# Patient Record
Sex: Female | Born: 1980 | Race: White | Hispanic: No | Marital: Married | Smoking: Never smoker
Health system: Southern US, Community
[De-identification: ages and names within clinical notes are randomized; demographics above are authoritative.]

## PROBLEM LIST (undated history)

## (undated) HISTORY — PX: CHOLECYSTECTOMY: SHX55

---

## 2005-08-23 ENCOUNTER — Ambulatory Visit: Payer: Self-pay | Admitting: Obstetrics and Gynecology

## 2012-08-11 ENCOUNTER — Ambulatory Visit: Payer: Self-pay | Admitting: Internal Medicine

## 2013-02-19 ENCOUNTER — Ambulatory Visit: Payer: Self-pay | Admitting: Surgery

## 2014-08-11 ENCOUNTER — Emergency Department: Admit: 2014-08-11 | Discharge status: Against Medical Advice | Payer: Self-pay | Admitting: Emergency Medicine

## 2014-08-11 LAB — BASIC METABOLIC PANEL
Anion Gap: 4 — ABNORMAL LOW (ref 7–16)
BUN: 9 mg/dL
CALCIUM: 8.5 mg/dL — AB
CREATININE: 0.68 mg/dL
Chloride: 104 mmol/L
Co2: 29 mmol/L
Glucose: 129 mg/dL — ABNORMAL HIGH
Potassium: 3.8 mmol/L
Sodium: 137 mmol/L

## 2014-08-11 LAB — CBC
HCT: 37.4 % (ref 35.0–47.0)
HGB: 12.1 g/dL (ref 12.0–16.0)
MCH: 27 pg (ref 26.0–34.0)
MCHC: 32.4 g/dL (ref 32.0–36.0)
MCV: 83 fL (ref 80–100)
Platelet: 263 10*3/uL (ref 150–440)
RBC: 4.49 10*6/uL (ref 3.80–5.20)
RDW: 15.8 % — ABNORMAL HIGH (ref 11.5–14.5)
WBC: 8.7 10*3/uL (ref 3.6–11.0)

## 2014-08-11 LAB — TROPONIN I

## 2014-08-15 ENCOUNTER — Other Ambulatory Visit: Payer: Self-pay | Admitting: Neurology

## 2014-08-15 DIAGNOSIS — H5712 Ocular pain, left eye: Secondary | ICD-10-CM

## 2014-08-15 DIAGNOSIS — M79602 Pain in left arm: Secondary | ICD-10-CM | POA: Insufficient documentation

## 2014-08-15 DIAGNOSIS — R200 Anesthesia of skin: Secondary | ICD-10-CM

## 2014-08-15 DIAGNOSIS — Z01812 Encounter for preprocedural laboratory examination: Secondary | ICD-10-CM

## 2014-08-15 HISTORY — DX: Encounter for preprocedural laboratory examination: Z01.812

## 2014-08-15 HISTORY — DX: Ocular pain, left eye: H57.12

## 2014-08-15 HISTORY — DX: Anesthesia of skin: R20.0

## 2014-08-15 HISTORY — DX: Pain in left arm: M79.602

## 2014-08-16 NOTE — Op Note (Signed)
PATIENT NAME:  Terri Fischer, Christiana M MR#:  [id number] DATE OF BIRTH:  [birth year]  DATE OF PROCEDURE:  02/19/2013  ATTENDING PHYSICIAN:  Dr. Salome Holmeshris Brenin Heidelberger.  PREOPERATIVE DIAGNOSIS:  Symptomatic cholelithiasis.   POSTOPERATIVE DIAGNOSIS: Symptomatic cholelithiasis.   PROCEDURE PERFORMED: Laparoscopic cholecystectomy.   ESTIMATED BLOOD LOSS: 20 mL.   COMPLICATIONS: None.   SPECIMEN:  Gallbladder.   ANESTHESIA: General endotracheal.   INDICATIONS FOR SURGERY: Ms. [name] is a pleasant 34 year old female who presents with right upper quadrant pain with fatty gallstones and pain, which is biliary in character. She thus was brought to the Operating Room suite for laparoscopic cholecystectomy.   DETAILS OF PROCEDURE AS FOLLOWS:   Informed consent was obtained.  Ms. [name] was brought to the operating room suite. She was laid supine on the operating room table. She was induced. Endotracheal tube was placed, general anesthesia was administered. Her abdomen was then prepped and draped in a standard surgical fashion. A timeout was then performed correctly identifying the patient name, operative site and procedure to be performed. A supraumbilical incision was made and deepened down to the fascia. The fascia was incised. The peritoneum was entered. Two stay sutures were placed through the fascia. Hasson trocar was placed in the abdomen. The abdomen was insufflated. An 11 mm epigastric and two 5 mm subcostal ports were placed at the anterior axillary and midclavicular line. The gallbladder then was retracted over the dome of the liver. The cystic duct and cystic artery were dissected out, so a critical view was obtained. The structures were clipped 3 times and ligated. The gallbladder was then taken off the gallbladder fossa using hook electrocautery. It was then taken out through the umbilical incision with an Endo Catch bag. The abdomen was then irrigated. The gallbladder fossa was examined, and using  electrocautery, was made hemostatic. The trocars were then taken out under direct visualization. Pneumoperitoneum was evacuated. The supraumbilical fascia was closed with a figure-of-eight 0 Vicryl. All incisions were then closed with 4-0 Monocryl deep dermal sutures. Steri-Strips, Telfa gauze and Tegaderm were then used to complete the dressing. The patient was then awoken, extubated and brought to the postanesthesia care unit. There were no immediate complications. Needle, sponge and instrument counts were correct at the end of the procedure.      ____________________________ Si Raiderhristopher A. Pravin Perezperez, MD cal:dmm D: 02/19/2013 09:48:10 ET T: 02/19/2013 11:09:28 ET JOB#: [id number]  cc: Cristal Deerhristopher A. Kassandra Meriweather, MD, <Dictator> Jarvis NewcomerHRISTOPHER A Phebe Dettmer MD ELECTRONICALLY SIGNED 02/26/2013 18:25

## 2014-08-18 ENCOUNTER — Other Ambulatory Visit: Payer: Self-pay | Admitting: Cardiovascular Disease

## 2014-08-21 ENCOUNTER — Ambulatory Visit
Admit: 2014-08-21 | Disposition: A | Discharge status: Home / Self Care | Payer: Self-pay | Attending: Neurology | Admitting: Neurology

## 2014-08-27 ENCOUNTER — Other Ambulatory Visit: Payer: Self-pay

## 2014-12-23 ENCOUNTER — Ambulatory Visit: Payer: Self-pay

## 2014-12-23 ENCOUNTER — Ambulatory Visit (INDEPENDENT_AMBULATORY_CARE_PROVIDER_SITE_OTHER): Payer: BLUE CROSS/BLUE SHIELD | Admitting: Podiatry

## 2014-12-23 ENCOUNTER — Encounter: Payer: Self-pay | Admitting: Podiatry

## 2014-12-23 VITALS — BP 114/76 | HR 84 | Resp 12

## 2014-12-23 DIAGNOSIS — M722 Plantar fascial fibromatosis: Secondary | ICD-10-CM

## 2014-12-23 MED ORDER — DICLOFENAC SODIUM 75 MG PO TBEC: 75.0000 mg | DELAYED_RELEASE_TABLET | Freq: Two times a day (BID) | ORAL | Status: DC

## 2014-12-23 MED ORDER — METHYLPREDNISOLONE 4 MG PO TBPK: ORAL_TABLET | ORAL | Status: DC

## 2014-12-23 NOTE — Patient Instructions (Signed)

## 2014-12-23 NOTE — Progress Notes (Signed)
   Subjective:    Patient ID: Terri Fischer, female    DOB: 12-Feb-1981, 34 y.o.   MRN: [id number]  HPI: She presents today as a 34 year old white female with pain to her bilateral heels. She states that they've been going on for about 6 months and seems to be getting worse. States that the right heel seems to be worse than the left of the lateral aspect of the left heel seems to be bothering her more. She denies any trauma states that morning pain is the worst.    Review of Systems  Musculoskeletal: Positive for gait problem.       Objective:   Physical Exam: 34 year old white female no acute distress vital signs are stable alert and oriented 3. Pulses are strongly palpable bilateral. Capillary fill time to digits 1 through 5 is immediate. Normal neurologic sensorium per Semmes-Weinstein monofilament. Deep tendon reflexes are brisk and equal bilateral. Muscle strength +5 over 5 dorsiflexion plantar flexors and inverters everters all intrinsic musculature is intact. Orthopedic evaluation of a straight solid joints distal to the ankle before range of motion without crepitation. Cutaneous evaluation demonstrates supple well-hydrated cutis no erythema edema saline as drainage or odor. She has pain on palpation medial calcaneal tubercles of the bilateral foot right greater than left. Radiographs 3 views each foot taken outside of the office do demonstrate plantar distally oriented calcaneal heel spurs with soft tissue increase in density. Cutaneous evaluation of the straight supple well-hydrated cutis assessing no signs of infection.        Assessment & Plan:  Assessment: Plantar fasciitis bilateral. 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  Plan: Discussed etiology pathology conservative versus surgical therapies. We discussed appropriate shoe gear stretching exercises ice therapy and shoe modifications. We dispensed a prescription for Medrol Dosepak to be followed by meloxicam. I also  injected her bilateral heels today with Kenalog and local and aesthetic. Dispensed a plantar fascial brace and the night splint. I will follow-up with her in 1 month.  Arbutus Ped DPM

## 2015-01-27 ENCOUNTER — Ambulatory Visit: Payer: BLUE CROSS/BLUE SHIELD | Admitting: Podiatry

## 2015-09-23 ENCOUNTER — Ambulatory Visit (INDEPENDENT_AMBULATORY_CARE_PROVIDER_SITE_OTHER): Payer: BLUE CROSS/BLUE SHIELD | Admitting: Podiatry

## 2015-09-23 ENCOUNTER — Encounter: Payer: Self-pay | Admitting: Podiatry

## 2015-09-23 VITALS — BP 128/76 | HR 91 | Resp 18

## 2015-09-23 DIAGNOSIS — M722 Plantar fascial fibromatosis: Secondary | ICD-10-CM | POA: Diagnosis not present

## 2015-09-23 MED ORDER — ETODOLAC 500 MG PO TABS: 500.0000 mg | ORAL_TABLET | Freq: Two times a day (BID) | ORAL | Status: DC

## 2015-09-23 NOTE — Progress Notes (Signed)
Patient ID: Terri SpragueSonia M Fischer, female   DOB: 1981-02-11, 35 y.o.   MRN: [id number]  Subjective: 35 year old female presents the office today for continued pain to her right heel which is been ongoing for several years and this is an ongoing issue. She previously saw a rheumatologist and she had an injection the last 2 years. She follow with Dr. Al CorpusHyatt last year and she had bilateral heel injections in the left side is doing well however she does have right heel pain continuing. She has been using night splint which she felt made area worse and she has been using the braces which did not help. She is also been stretching icing. She has tried changing her shoes. She appears to be frustrated at this point with her heel. No recent injury or trauma. No tingling or numbness. The pain does not wake her up at night. Denies any systemic complaints such as fevers, chills, nausea, vomiting. No acute changes since last appointment, and no other complaints at this time.   Objective: AAO x3, NAD DP/PT pulses palpable bilaterally, CRT less than 3 seconds There is tenderness palpation of the plantar medial tubercle of the calcaneus at the insertion of the plantar fashion on the right side. There is no pain on the course of plantar fascial in the arch of the foot. There is no pain with medial to lateral compression of the calcaneus. There is no pain on the course or insertion Achilles tendon. No overlying edema, erythema, increase in warmth. There is no pain with left-sided this time. No areas of pinpoint bony tenderness or pain with vibratory sensation. MMT 5/5, ROM WNL. No edema, erythema, increase in warmth to bilateral lower extremities.  No open lesions or pre-ulcerative lesions.  No pain with calf compression, swelling, warmth, erythema  Assessment: Chronic right heel pain, plantar fasciitis  Plan: -All treatment options discussed with the patient including all alternatives, risks, complications.  -At this point  discussed both conservative and surgical treatment options. During the discussion multiple treatment options she appeared to be frustrated she is attended multiple treatments. However I would like to get her on a consistent treatment plan. I discussed steroid injection for which she wishes to proceed. Under sterile conditions a mixture of Kenalog and local anesthetic was infiltrated into the area of maximal tenderness without complications. Post injection care discussed. I discussed her custom orthotics. She was scanned for orthotics and she'll call the office today to mention it to proceed with them. She's never had inserts made. Prescribed etodolac and discussed side effects this medicine. Also discussed with her continuing to stretch, ice as well as shoe gear changes. I discussed with her other treatment options including physical therapy, EPAT, surgery. She'll let to hold off on this options at this point as well. At next appointment she is not getting relief of symptoms she states that she will possibly consider EPAT.  -Follow-up as scheduled or sooner if needed. -Patient encouraged to call the office with any questions, concerns, change in symptoms.    Ovid CurdMatthew Airyn Ellzey, DPM

## 2015-10-10 ENCOUNTER — Ambulatory Visit (INDEPENDENT_AMBULATORY_CARE_PROVIDER_SITE_OTHER): Payer: BLUE CROSS/BLUE SHIELD | Admitting: *Deleted

## 2015-10-10 ENCOUNTER — Encounter: Payer: Self-pay | Admitting: *Deleted

## 2015-10-10 DIAGNOSIS — M722 Plantar fascial fibromatosis: Secondary | ICD-10-CM

## 2015-10-10 NOTE — Progress Notes (Signed)
Dispensed orthotics. Patient states that the right orthotic was uncomfortable. Feedback from the pt indicated that the shell of the orthotic was hitting at the bend of the ball of her foot. Will send the orthotics back for adjustment and notify the pt when they come back.

## 2015-10-21 ENCOUNTER — Ambulatory Visit: Payer: BLUE CROSS/BLUE SHIELD | Admitting: Podiatry

## 2015-10-29 ENCOUNTER — Ambulatory Visit (INDEPENDENT_AMBULATORY_CARE_PROVIDER_SITE_OTHER): Payer: BLUE CROSS/BLUE SHIELD | Admitting: Podiatry

## 2015-10-29 DIAGNOSIS — M722 Plantar fascial fibromatosis: Secondary | ICD-10-CM

## 2015-11-04 ENCOUNTER — Telehealth: Payer: Self-pay | Admitting: *Deleted

## 2015-11-04 MED ORDER — ETODOLAC 500 MG PO TABS: 500.0000 mg | ORAL_TABLET | Freq: Two times a day (BID) | ORAL | Status: DC

## 2015-11-04 NOTE — Telephone Encounter (Signed)
Refill request for Etodolac 500mg .  Dr. Ardelle AntonWagoner ordered as previously no additional refills until seen.

## 2015-11-14 NOTE — Progress Notes (Signed)
Patient states that her right orthotic js still not comfortable and doesn't fit correctly. Betha foam casted the pt and molds were sent out as well as the old orthotic for a remake. Will contact pt once it comes in.

## 2015-11-21 ENCOUNTER — Encounter: Payer: Self-pay | Admitting: *Deleted

## 2015-11-28 ENCOUNTER — Encounter: Payer: Self-pay | Admitting: *Deleted

## 2015-12-31 ENCOUNTER — Ambulatory Visit (INDEPENDENT_AMBULATORY_CARE_PROVIDER_SITE_OTHER): Payer: BLUE CROSS/BLUE SHIELD | Admitting: *Deleted

## 2015-12-31 DIAGNOSIS — M722 Plantar fascial fibromatosis: Secondary | ICD-10-CM

## 2016-01-06 NOTE — Progress Notes (Signed)
Patient presents today stating that her right orthotic was sent back for remake, but doesn't match her left. She says its uncomfortable. Betha recasted pt with plaster molds and took pics of old orthotics and sent back with cast to [facility] for remake of both orthotics. Will contact pt once they arrive.

## 2016-01-29 ENCOUNTER — Telehealth: Payer: Self-pay | Admitting: *Deleted

## 2016-01-29 NOTE — Telephone Encounter (Signed)
Checking the status of my orthotics I haven't heard anything.

## 2016-01-30 NOTE — Telephone Encounter (Signed)
They were just ordered on 01/11/16. They have not come in yet. I will call her and let her know. TLowella Dandy

## 2016-02-18 ENCOUNTER — Encounter: Payer: Self-pay | Admitting: Podiatry

## 2016-09-17 DIAGNOSIS — R7982 Elevated C-reactive protein (CRP): Secondary | ICD-10-CM

## 2016-09-17 DIAGNOSIS — M25471 Effusion, right ankle: Secondary | ICD-10-CM

## 2016-09-17 DIAGNOSIS — R21 Rash and other nonspecific skin eruption: Secondary | ICD-10-CM

## 2016-09-17 DIAGNOSIS — M25472 Effusion, left ankle: Secondary | ICD-10-CM

## 2016-09-17 HISTORY — DX: Elevated C-reactive protein (CRP): R79.82

## 2016-09-17 HISTORY — DX: Effusion, right ankle: M25.471

## 2016-09-28 DIAGNOSIS — D869 Sarcoidosis, unspecified: Secondary | ICD-10-CM | POA: Insufficient documentation

## 2019-07-02 ENCOUNTER — Other Ambulatory Visit: Payer: Self-pay | Admitting: Nurse Practitioner

## 2019-07-02 DIAGNOSIS — Z1231 Encounter for screening mammogram for malignant neoplasm of breast: Secondary | ICD-10-CM

## 2019-08-01 ENCOUNTER — Ambulatory Visit
Admission: RE | Admit: 2019-08-01 | Discharge: 2019-08-01 | Disposition: A | Discharge status: Home / Self Care | Payer: BLUE CROSS/BLUE SHIELD | Source: Ambulatory Visit | Attending: Nurse Practitioner | Admitting: Nurse Practitioner

## 2019-08-01 ENCOUNTER — Encounter: Payer: Self-pay | Admitting: Radiology

## 2019-08-01 DIAGNOSIS — Z1231 Encounter for screening mammogram for malignant neoplasm of breast: Secondary | ICD-10-CM | POA: Diagnosis present

## 2020-04-08 ENCOUNTER — Telehealth: Payer: Self-pay | Admitting: Podiatry

## 2020-04-08 NOTE — Telephone Encounter (Signed)
Pt left voicemail for a call back about orthotics she got several years ago.  I returned call and pt got them in 2017 and was asking about a warranty. I told her usually a 1 yr warranty. She then asked the cost and I told her currently they are 438.00 for the pair. We then discussed refurbishing for 90.00 and she asked if they were cracked if they could be refurbished. I told pt Raiford Noble would be in the [place] office tomorrow to stop in and he can look at them quickly and see if they can be refurbished or not.

## 2020-04-16 ENCOUNTER — Other Ambulatory Visit: Payer: Self-pay

## 2020-04-16 ENCOUNTER — Ambulatory Visit (INDEPENDENT_AMBULATORY_CARE_PROVIDER_SITE_OTHER): Payer: BLUE CROSS/BLUE SHIELD | Admitting: Orthotics

## 2020-04-16 DIAGNOSIS — M722 Plantar fascial fibromatosis: Secondary | ICD-10-CM

## 2020-04-16 NOTE — Progress Notes (Signed)
Patient came in today to get her f/o refurbisheds; will pick up in January

## 2020-04-30 ENCOUNTER — Other Ambulatory Visit: Payer: BLUE CROSS/BLUE SHIELD | Admitting: Orthotics

## 2020-04-30 ENCOUNTER — Other Ambulatory Visit: Payer: Self-pay

## 2021-04-09 ENCOUNTER — Other Ambulatory Visit: Payer: Self-pay | Admitting: Nurse Practitioner

## 2021-04-09 DIAGNOSIS — Z1231 Encounter for screening mammogram for malignant neoplasm of breast: Secondary | ICD-10-CM

## 2021-06-03 ENCOUNTER — Other Ambulatory Visit: Payer: Self-pay

## 2021-06-03 ENCOUNTER — Ambulatory Visit
Admission: RE | Admit: 2021-06-03 | Discharge: 2021-06-03 | Disposition: A | Discharge status: Home / Self Care | Payer: BC Managed Care – PPO | Source: Ambulatory Visit | Attending: Nurse Practitioner | Admitting: Nurse Practitioner

## 2021-06-03 DIAGNOSIS — Z1231 Encounter for screening mammogram for malignant neoplasm of breast: Secondary | ICD-10-CM | POA: Diagnosis present

## 2021-06-10 ENCOUNTER — Other Ambulatory Visit: Payer: Self-pay | Admitting: Nurse Practitioner

## 2021-06-10 DIAGNOSIS — N6489 Other specified disorders of breast: Secondary | ICD-10-CM

## 2021-06-10 DIAGNOSIS — R928 Other abnormal and inconclusive findings on diagnostic imaging of breast: Secondary | ICD-10-CM

## 2021-06-16 ENCOUNTER — Other Ambulatory Visit: Payer: Self-pay

## 2021-06-16 ENCOUNTER — Ambulatory Visit
Admission: RE | Admit: 2021-06-16 | Discharge: 2021-06-16 | Disposition: A | Discharge status: Home / Self Care | Payer: BC Managed Care – PPO | Source: Ambulatory Visit | Attending: Nurse Practitioner | Admitting: Nurse Practitioner

## 2021-06-16 DIAGNOSIS — N6489 Other specified disorders of breast: Secondary | ICD-10-CM | POA: Insufficient documentation

## 2021-06-16 DIAGNOSIS — R928 Other abnormal and inconclusive findings on diagnostic imaging of breast: Secondary | ICD-10-CM | POA: Diagnosis not present

## 2021-10-01 DIAGNOSIS — Z1211 Encounter for screening for malignant neoplasm of colon: Secondary | ICD-10-CM | POA: Insufficient documentation

## 2021-10-01 DIAGNOSIS — F988 Other specified behavioral and emotional disorders with onset usually occurring in childhood and adolescence: Secondary | ICD-10-CM | POA: Insufficient documentation

## 2021-10-01 DIAGNOSIS — R194 Change in bowel habit: Secondary | ICD-10-CM | POA: Insufficient documentation

## 2022-05-27 ENCOUNTER — Other Ambulatory Visit: Payer: Self-pay | Admitting: Family

## 2022-05-27 DIAGNOSIS — M25561 Pain in right knee: Secondary | ICD-10-CM

## 2022-05-31 ENCOUNTER — Ambulatory Visit: Payer: BC Managed Care – PPO

## 2022-05-31 DIAGNOSIS — M25561 Pain in right knee: Secondary | ICD-10-CM

## 2022-06-17 ENCOUNTER — Other Ambulatory Visit: Payer: Self-pay

## 2022-06-17 MED ORDER — LISDEXAMFETAMINE DIMESYLATE 40 MG PO CAPS: 40.0000 mg | ORAL_CAPSULE | ORAL | 0 refills | Status: DC

## 2022-06-28 ENCOUNTER — Other Ambulatory Visit: Payer: Self-pay | Admitting: Nurse Practitioner

## 2022-07-02 ENCOUNTER — Other Ambulatory Visit: Payer: Self-pay | Admitting: Family

## 2022-07-09 ENCOUNTER — Encounter: Payer: Self-pay | Admitting: Family

## 2022-07-09 ENCOUNTER — Ambulatory Visit (INDEPENDENT_AMBULATORY_CARE_PROVIDER_SITE_OTHER): Payer: BC Managed Care – PPO | Admitting: Family

## 2022-07-09 ENCOUNTER — Other Ambulatory Visit: Payer: Self-pay | Admitting: Family

## 2022-07-09 DIAGNOSIS — J301 Allergic rhinitis due to pollen: Secondary | ICD-10-CM | POA: Insufficient documentation

## 2022-07-09 DIAGNOSIS — N921 Excessive and frequent menstruation with irregular cycle: Secondary | ICD-10-CM | POA: Insufficient documentation

## 2022-07-09 DIAGNOSIS — K219 Gastro-esophageal reflux disease without esophagitis: Secondary | ICD-10-CM | POA: Insufficient documentation

## 2022-07-09 DIAGNOSIS — L0882 Omphalitis not of newborn: Secondary | ICD-10-CM

## 2022-07-09 NOTE — Progress Notes (Signed)
Established Patient Office Visit  Subjective:  Patient ID: Terri Fischer, female    DOB: 11-Sep-1980  Age: 42 y.o. MRN: [id number]  Chief Complaint  Patient presents with   possible infection in umbilicus    Patient is here with possible infection in her umbilicus.  She states that these started when she had her cholecystectomy, and that she has to keep an alcohol swab in her umbilicus every day.  She often cleans with witch hazel as well, but she says that this time it is not going away.   The other day she reports that she took the alcohol swab out and it was covered in brown discharge. She also reports that it is foul smelling.   No other concerns today.     No other concerns at this time.   Past Medical History:  Diagnosis Date   Arm pain, left 08/15/2014   CRP elevated 09/17/2016   Left sided numbness 08/15/2014   Pain in left eye 08/15/2014   Pre-procedure lab exam 08/15/2014   Skin rash 09/17/2016   Formatting of this note might be different from the original. erythema nodosum   Swelling of both ankles 09/17/2016    Past Surgical History:  Procedure Laterality Date   CHOLECYSTECTOMY      Social History   Socioeconomic History   Marital status: Married    Spouse name: Not on file   Number of children: Not on file   Years of education: Not on file   Highest education level: Not on file  Occupational History   Not on file  Tobacco Use   Smoking status: Never   Smokeless tobacco: Not on file  Substance and Sexual Activity   Alcohol use: No    Alcohol/week: 0.0 standard drinks of alcohol   Drug use: No   Sexual activity: Not on file  Other Topics Concern   Not on file  Social History Narrative   Not on file   Social Determinants of Health   Financial Resource Strain: Not on file  Food Insecurity: Not on file  Transportation Needs: Not on file  Physical Activity: Not on file  Stress: Not on file  Social Connections: Not on file  Intimate Partner  Violence: Not on file    Family History  Problem Relation Age of Onset   Cancer Mother    Hyperlipidemia Father    Hypertension Father    Diabetes Father     Allergies  Allergen Reactions   Loratadine     Other Reaction(s): Other (See Comments)  Lip edema and mouth blisters and tongue tingling    Review of Systems  Skin:        Discharge and pain at umbilicus.   All other systems reviewed and are negative.      Objective:   There were no vitals taken for this visit.  There were no vitals filed for this visit.  Physical Exam Vitals and nursing note reviewed.  Constitutional:      Appearance: Normal appearance.  HENT:     Head: Normocephalic and atraumatic.  Abdominal:     Tenderness: There is abdominal tenderness in the periumbilical area.    Neurological:     Mental Status: She is alert.      No results found for any visits on 07/09/22.  No results found for this or any previous visit (from the past 2160 hour(s)).    Assessment & Plan:   Problem List Items Addressed This Visit  Allergic rhinitis due to pollen   GERD (gastroesophageal reflux disease)   Menometrorrhagia   Other Visit Diagnoses     Omphalitis in adult    -  Primary   will get a CT scan of her abdomen.  Also getting swab for culture.   Relevant Orders   Culture, Wound   CT ABDOMEN W CONTRAST       Return once CT scan is completed, for F/U.   Total time spent: 20 minutes  Mechele Claude, FNP  07/09/2022

## 2022-07-12 LAB — WOUND CULTURE

## 2022-07-13 ENCOUNTER — Ambulatory Visit (INDEPENDENT_AMBULATORY_CARE_PROVIDER_SITE_OTHER): Payer: BC Managed Care – PPO

## 2022-07-13 DIAGNOSIS — L03316 Cellulitis of umbilicus: Secondary | ICD-10-CM

## 2022-07-13 DIAGNOSIS — L0882 Omphalitis not of newborn: Secondary | ICD-10-CM

## 2022-07-13 MED ORDER — IOHEXOL 300 MG/ML  SOLN
100.0000 mL | Freq: Once | INTRAMUSCULAR | Status: AC | PRN
  Administered 2022-07-13: 100 mL via INTRAVENOUS

## 2022-07-14 ENCOUNTER — Other Ambulatory Visit: Payer: Self-pay | Admitting: Family

## 2022-07-14 MED ORDER — CEFDINIR 300 MG PO CAPS: 300.0000 mg | ORAL_CAPSULE | Freq: Two times a day (BID) | ORAL | 0 refills | Status: DC

## 2022-07-14 MED ORDER — AMPHETAMINE-DEXTROAMPHET ER 20 MG PO CP24: 20.0000 mg | ORAL_CAPSULE | Freq: Every morning | ORAL | 0 refills | Status: DC

## 2022-07-14 MED ORDER — ALPRAZOLAM 0.5 MG PO TABS: 0.5000 mg | ORAL_TABLET | Freq: Two times a day (BID) | ORAL | 3 refills | Status: DC | PRN

## 2022-08-18 ENCOUNTER — Other Ambulatory Visit: Payer: Self-pay

## 2022-08-18 MED ORDER — LISDEXAMFETAMINE DIMESYLATE 40 MG PO CAPS: 40.0000 mg | ORAL_CAPSULE | ORAL | 0 refills | Status: DC

## 2022-09-20 ENCOUNTER — Other Ambulatory Visit: Payer: Self-pay | Admitting: Family

## 2022-09-22 ENCOUNTER — Other Ambulatory Visit: Payer: Self-pay | Admitting: Family

## 2022-09-22 DIAGNOSIS — Z1231 Encounter for screening mammogram for malignant neoplasm of breast: Secondary | ICD-10-CM

## 2022-09-22 MED ORDER — AMPHETAMINE-DEXTROAMPHET ER 20 MG PO CP24: 20.0000 mg | ORAL_CAPSULE | Freq: Every morning | ORAL | 0 refills | Status: DC

## 2022-10-02 ENCOUNTER — Encounter: Payer: Self-pay | Admitting: Family

## 2022-10-04 ENCOUNTER — Other Ambulatory Visit: Payer: Self-pay | Admitting: Family

## 2022-10-04 MED ORDER — SULFAMETHOXAZOLE-TRIMETHOPRIM 800-160 MG PO TABS: 1.0000 | ORAL_TABLET | Freq: Two times a day (BID) | ORAL | 0 refills | Status: AC

## 2022-10-04 MED ORDER — MUPIROCIN 2 % EX OINT: 1.0000 | TOPICAL_OINTMENT | Freq: Two times a day (BID) | CUTANEOUS | 0 refills | Status: AC

## 2022-11-03 ENCOUNTER — Other Ambulatory Visit: Payer: Self-pay | Admitting: Family

## 2022-11-03 DIAGNOSIS — G8929 Other chronic pain: Secondary | ICD-10-CM

## 2022-11-04 ENCOUNTER — Ambulatory Visit: Payer: BC Managed Care – PPO

## 2022-11-04 ENCOUNTER — Ambulatory Visit (INDEPENDENT_AMBULATORY_CARE_PROVIDER_SITE_OTHER): Payer: BC Managed Care – PPO

## 2022-11-04 ENCOUNTER — Ambulatory Visit
Admission: RE | Admit: 2022-11-04 | Discharge: 2022-11-04 | Disposition: A | Discharge status: Home / Self Care | Payer: BC Managed Care – PPO | Source: Ambulatory Visit | Attending: Family | Admitting: Family

## 2022-11-04 DIAGNOSIS — Z1231 Encounter for screening mammogram for malignant neoplasm of breast: Secondary | ICD-10-CM | POA: Insufficient documentation

## 2022-11-04 DIAGNOSIS — G8929 Other chronic pain: Secondary | ICD-10-CM | POA: Diagnosis not present

## 2022-11-04 DIAGNOSIS — M25561 Pain in right knee: Secondary | ICD-10-CM

## 2022-11-17 ENCOUNTER — Ambulatory Visit: Payer: BC Managed Care – PPO | Admitting: Family

## 2022-11-18 ENCOUNTER — Other Ambulatory Visit: Payer: Self-pay

## 2022-11-18 DIAGNOSIS — G8929 Other chronic pain: Secondary | ICD-10-CM

## 2022-11-18 MED ORDER — AMPHETAMINE-DEXTROAMPHET ER 20 MG PO CP24: 20.0000 mg | ORAL_CAPSULE | Freq: Every morning | ORAL | 0 refills | Status: DC

## 2022-11-25 NOTE — Addendum Note (Signed)
Addended by: Grayling Congress on: 11/25/2022 12:29 PM   Modules accepted: Orders

## 2022-11-30 ENCOUNTER — Other Ambulatory Visit: Payer: Self-pay

## 2022-12-01 MED ORDER — ALPRAZOLAM 0.5 MG PO TABS: 0.5000 mg | ORAL_TABLET | Freq: Two times a day (BID) | ORAL | 3 refills | Status: DC | PRN

## 2023-01-18 ENCOUNTER — Other Ambulatory Visit: Payer: Self-pay | Admitting: Family

## 2023-01-18 MED ORDER — AMPHETAMINE-DEXTROAMPHET ER 20 MG PO CP24: 20.0000 mg | ORAL_CAPSULE | Freq: Every morning | ORAL | 0 refills | Status: DC

## 2023-03-04 ENCOUNTER — Other Ambulatory Visit: Payer: Self-pay | Admitting: Family

## 2023-03-04 MED ORDER — AMPHETAMINE-DEXTROAMPHET ER 20 MG PO CP24: 20.0000 mg | ORAL_CAPSULE | Freq: Every morning | ORAL | 0 refills | Status: DC

## 2023-03-29 ENCOUNTER — Ambulatory Visit: Payer: BC Managed Care – PPO | Admitting: Family

## 2023-04-06 ENCOUNTER — Other Ambulatory Visit: Payer: Self-pay

## 2023-04-08 MED ORDER — ALPRAZOLAM 0.5 MG PO TABS: 0.5000 mg | ORAL_TABLET | Freq: Two times a day (BID) | ORAL | 3 refills | Status: DC | PRN

## 2023-04-08 MED ORDER — AMPHETAMINE-DEXTROAMPHET ER 20 MG PO CP24: 20.0000 mg | ORAL_CAPSULE | Freq: Every morning | ORAL | 0 refills | Status: DC

## 2023-05-12 MED ORDER — AMPHETAMINE-DEXTROAMPHETAMINE 10 MG PO TABS: 10.0000 mg | ORAL_TABLET | Freq: Two times a day (BID) | ORAL | 0 refills | Status: DC

## 2023-05-12 NOTE — Addendum Note (Signed)
Addended by: Grayling Congress on: 05/12/2023 02:23 PM   Modules accepted: Orders

## 2023-05-30 ENCOUNTER — Other Ambulatory Visit: Payer: Self-pay

## 2023-05-30 DIAGNOSIS — R7303 Prediabetes: Secondary | ICD-10-CM

## 2023-05-30 DIAGNOSIS — R5383 Other fatigue: Secondary | ICD-10-CM

## 2023-05-30 DIAGNOSIS — E559 Vitamin D deficiency, unspecified: Secondary | ICD-10-CM

## 2023-05-30 DIAGNOSIS — E538 Deficiency of other specified B group vitamins: Secondary | ICD-10-CM

## 2023-05-30 DIAGNOSIS — E782 Mixed hyperlipidemia: Secondary | ICD-10-CM

## 2023-05-30 DIAGNOSIS — I10 Essential (primary) hypertension: Secondary | ICD-10-CM

## 2023-05-30 DIAGNOSIS — E039 Hypothyroidism, unspecified: Secondary | ICD-10-CM

## 2023-06-01 MED ORDER — VITAMIN D (ERGOCALCIFEROL) 1.25 MG (50000 UNIT) PO CAPS: 50000.0000 [IU] | ORAL_CAPSULE | ORAL | 3 refills | Status: DC

## 2023-06-03 LAB — LIPID PANEL
Chol/HDL Ratio: 4.9 {ratio} — ABNORMAL HIGH (ref 0.0–4.4)
Cholesterol, Total: 252 mg/dL — ABNORMAL HIGH (ref 100–199)
HDL: 51 mg/dL (ref >39)
LDL Chol Calc (NIH): 165 mg/dL — ABNORMAL HIGH (ref 0–99)
Triglycerides: 197 mg/dL — ABNORMAL HIGH (ref 0–149)
VLDL Cholesterol Cal: 36 mg/dL (ref 5–40)

## 2023-06-03 LAB — CBC WITH DIFFERENTIAL/PLATELET
Basophils Absolute: 0 10*3/uL (ref 0.0–0.2)
Basos: 0 %
EOS (ABSOLUTE): 0.2 10*3/uL (ref 0.0–0.4)
Eos: 2 %
Hematocrit: 43.6 % (ref 34.0–46.6)
Hemoglobin: 14.2 g/dL (ref 11.1–15.9)
Immature Grans (Abs): 0.1 10*3/uL (ref 0.0–0.1)
Immature Granulocytes: 1 %
Lymphocytes Absolute: 1.6 10*3/uL (ref 0.7–3.1)
Lymphs: 18 %
MCH: 28.6 pg (ref 26.6–33.0)
MCHC: 32.6 g/dL (ref 31.5–35.7)
MCV: 88 fL (ref 79–97)
Monocytes Absolute: 0.6 10*3/uL (ref 0.1–0.9)
Monocytes: 7 %
Neutrophils Absolute: 6.2 10*3/uL (ref 1.4–7.0)
Neutrophils: 72 %
Platelets: 316 10*3/uL (ref 150–450)
RBC: 4.97 x10E6/uL (ref 3.77–5.28)
RDW: 13.8 % (ref 11.7–15.4)
WBC: 8.6 10*3/uL (ref 3.4–10.8)

## 2023-06-03 LAB — TSH+T4F+T3FREE
Free T4: 1.07 ng/dL (ref 0.82–1.77)
T3, Free: 3.6 pg/mL (ref 2.0–4.4)
TSH: 1.67 u[IU]/mL (ref 0.450–4.500)

## 2023-06-03 LAB — HEMOGLOBIN A1C
Est. average glucose Bld gHb Est-mCnc: 103 mg/dL
Hgb A1c MFr Bld: 5.2 % (ref 4.8–5.6)

## 2023-06-03 LAB — CMP14+EGFR
ALT: 24 [IU]/L (ref 0–32)
AST: 20 [IU]/L (ref 0–40)
Albumin: 4.2 g/dL (ref 3.9–4.9)
Alkaline Phosphatase: 152 [IU]/L — ABNORMAL HIGH (ref 44–121)
BUN/Creatinine Ratio: 17 (ref 9–23)
BUN: 11 mg/dL (ref 6–24)
Bilirubin Total: 0.3 mg/dL (ref 0.0–1.2)
CO2: 24 mmol/L (ref 20–29)
Calcium: 9.3 mg/dL (ref 8.7–10.2)
Chloride: 101 mmol/L (ref 96–106)
Creatinine, Ser: 0.64 mg/dL (ref 0.57–1.00)
Globulin, Total: 2.7 g/dL (ref 1.5–4.5)
Glucose: 86 mg/dL (ref 70–99)
Potassium: 5 mmol/L (ref 3.5–5.2)
Sodium: 140 mmol/L (ref 134–144)
Total Protein: 6.9 g/dL (ref 6.0–8.5)
eGFR: 112 mL/min/{1.73_m2} (ref >59)

## 2023-06-03 LAB — PTH, INTACT AND CALCIUM: PTH: 39 pg/mL (ref 15–65)

## 2023-06-03 LAB — FSH+PROG+E2+SHBG
Estradiol: 108 pg/mL
FSH: 7.2 m[IU]/mL
Progesterone: 0.1 ng/mL
Sex Hormone Binding: 74.5 nmol/L (ref 24.6–122.0)

## 2023-06-03 LAB — VITAMIN D 25 HYDROXY (VIT D DEFICIENCY, FRACTURES): Vit D, 25-Hydroxy: 56.6 ng/mL (ref 30.0–100.0)

## 2023-06-03 LAB — VITAMIN B12: Vitamin B-12: 597 pg/mL (ref 232–1245)

## 2023-07-04 ENCOUNTER — Other Ambulatory Visit: Payer: Self-pay

## 2023-07-04 ENCOUNTER — Other Ambulatory Visit: Payer: Self-pay | Admitting: Family

## 2023-07-04 MED ORDER — PREDNISONE 20 MG PO TABS: 40.0000 mg | ORAL_TABLET | Freq: Every day | ORAL | 0 refills | Status: DC

## 2023-07-06 MED ORDER — LISDEXAMFETAMINE DIMESYLATE 40 MG PO CAPS: 40.0000 mg | ORAL_CAPSULE | ORAL | 0 refills | Status: DC

## 2023-08-10 MED ORDER — ALBENDAZOLE 200 MG PO TABS: 400.0000 mg | ORAL_TABLET | Freq: Once | ORAL | 0 refills | Status: AC

## 2023-08-10 NOTE — Addendum Note (Signed)
 Addended by: Evander Hills on: 08/10/2023 02:28 PM   Modules accepted: Orders

## 2023-08-30 ENCOUNTER — Other Ambulatory Visit: Payer: Self-pay | Admitting: Family

## 2023-09-26 ENCOUNTER — Other Ambulatory Visit: Payer: Self-pay

## 2023-09-27 ENCOUNTER — Ambulatory Visit
Admission: RE | Admit: 2023-09-27 | Discharge: 2023-09-27 | Disposition: A | Discharge status: Home / Self Care | Source: Ambulatory Visit | Attending: Family | Admitting: Family

## 2023-09-27 DIAGNOSIS — G8929 Other chronic pain: Secondary | ICD-10-CM

## 2023-09-27 MED ORDER — AMPHETAMINE-DEXTROAMPHETAMINE 10 MG PO TABS: 10.0000 mg | ORAL_TABLET | Freq: Two times a day (BID) | ORAL | 0 refills | Status: AC

## 2023-10-10 ENCOUNTER — Ambulatory Visit: Payer: Self-pay

## 2023-10-18 ENCOUNTER — Ambulatory Visit (INDEPENDENT_AMBULATORY_CARE_PROVIDER_SITE_OTHER)

## 2023-10-18 ENCOUNTER — Other Ambulatory Visit: Payer: Self-pay | Admitting: Family

## 2023-10-18 DIAGNOSIS — M8589 Other specified disorders of bone density and structure, multiple sites: Secondary | ICD-10-CM | POA: Diagnosis not present

## 2023-10-21 ENCOUNTER — Ambulatory Visit: Payer: Self-pay

## 2023-10-27 ENCOUNTER — Ambulatory Visit

## 2023-10-27 DIAGNOSIS — K640 First degree hemorrhoids: Secondary | ICD-10-CM | POA: Diagnosis not present

## 2023-10-27 DIAGNOSIS — Z1211 Encounter for screening for malignant neoplasm of colon: Secondary | ICD-10-CM | POA: Diagnosis present

## 2023-10-27 DIAGNOSIS — K635 Polyp of colon: Secondary | ICD-10-CM | POA: Diagnosis not present

## 2023-10-27 DIAGNOSIS — K5730 Diverticulosis of large intestine without perforation or abscess without bleeding: Secondary | ICD-10-CM | POA: Diagnosis not present

## 2023-11-02 ENCOUNTER — Other Ambulatory Visit: Payer: Self-pay

## 2023-11-02 DIAGNOSIS — Z1231 Encounter for screening mammogram for malignant neoplasm of breast: Secondary | ICD-10-CM

## 2023-11-03 MED ORDER — LISDEXAMFETAMINE DIMESYLATE 40 MG PO CAPS: 40.0000 mg | ORAL_CAPSULE | ORAL | 0 refills | Status: DC

## 2023-12-05 ENCOUNTER — Other Ambulatory Visit: Payer: Self-pay

## 2023-12-08 MED ORDER — AMPHETAMINE-DEXTROAMPHET ER 20 MG PO CP24: 20.0000 mg | ORAL_CAPSULE | Freq: Every morning | ORAL | 0 refills | Status: DC

## 2023-12-20 ENCOUNTER — Other Ambulatory Visit: Payer: Self-pay

## 2023-12-21 MED ORDER — ALPRAZOLAM 0.5 MG PO TABS: 0.5000 mg | ORAL_TABLET | Freq: Two times a day (BID) | ORAL | 2 refills | Status: DC | PRN

## 2024-01-20 ENCOUNTER — Other Ambulatory Visit: Payer: Self-pay | Admitting: Cardiology

## 2024-01-20 MED ORDER — AZITHROMYCIN 250 MG PO TABS: ORAL_TABLET | ORAL | 0 refills | Status: AC

## 2024-01-31 IMAGING — MG MM DIGITAL DIAGNOSTIC UNILAT*R* W/ TOMO W/ CAD
4 series · 4 of 12 positions shown · non-contrast
Comparison: Prior films

CLINICAL DATA: Callback from screening mammogram for possible
asymmetry right breast

EXAM:
DIGITAL DIAGNOSTIC UNILATERAL RIGHT MAMMOGRAM WITH TOMOSYNTHESIS AND
CAD
TECHNIQUE: Right digital diagnostic mammography and breast tomosynthesis was
performed. The images were evaluated with computer-aided detection.

[R ML synth-2D]
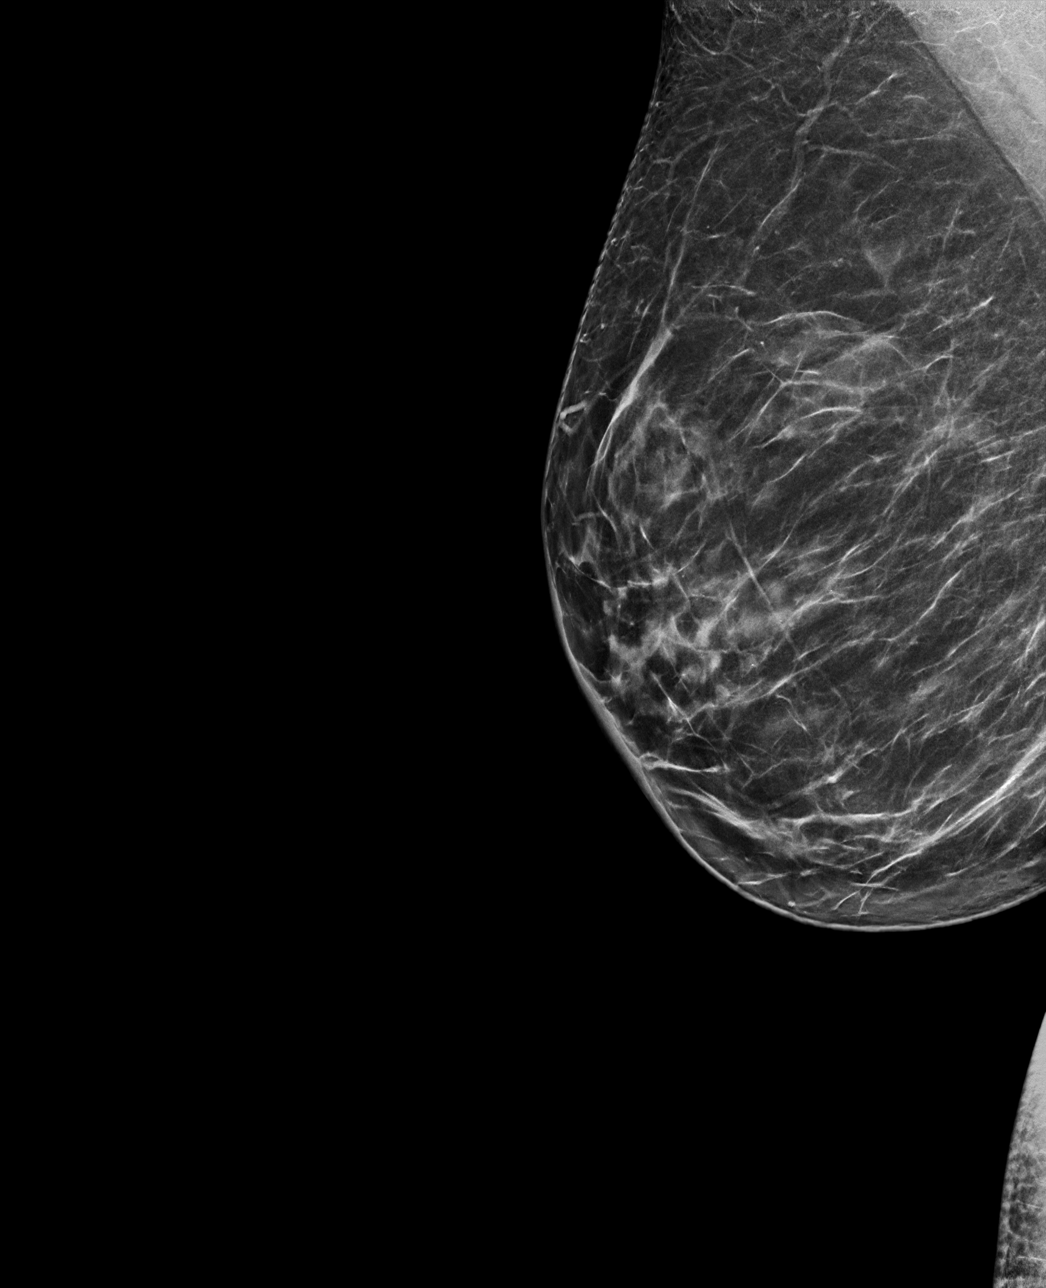

[R CC synth-2D]
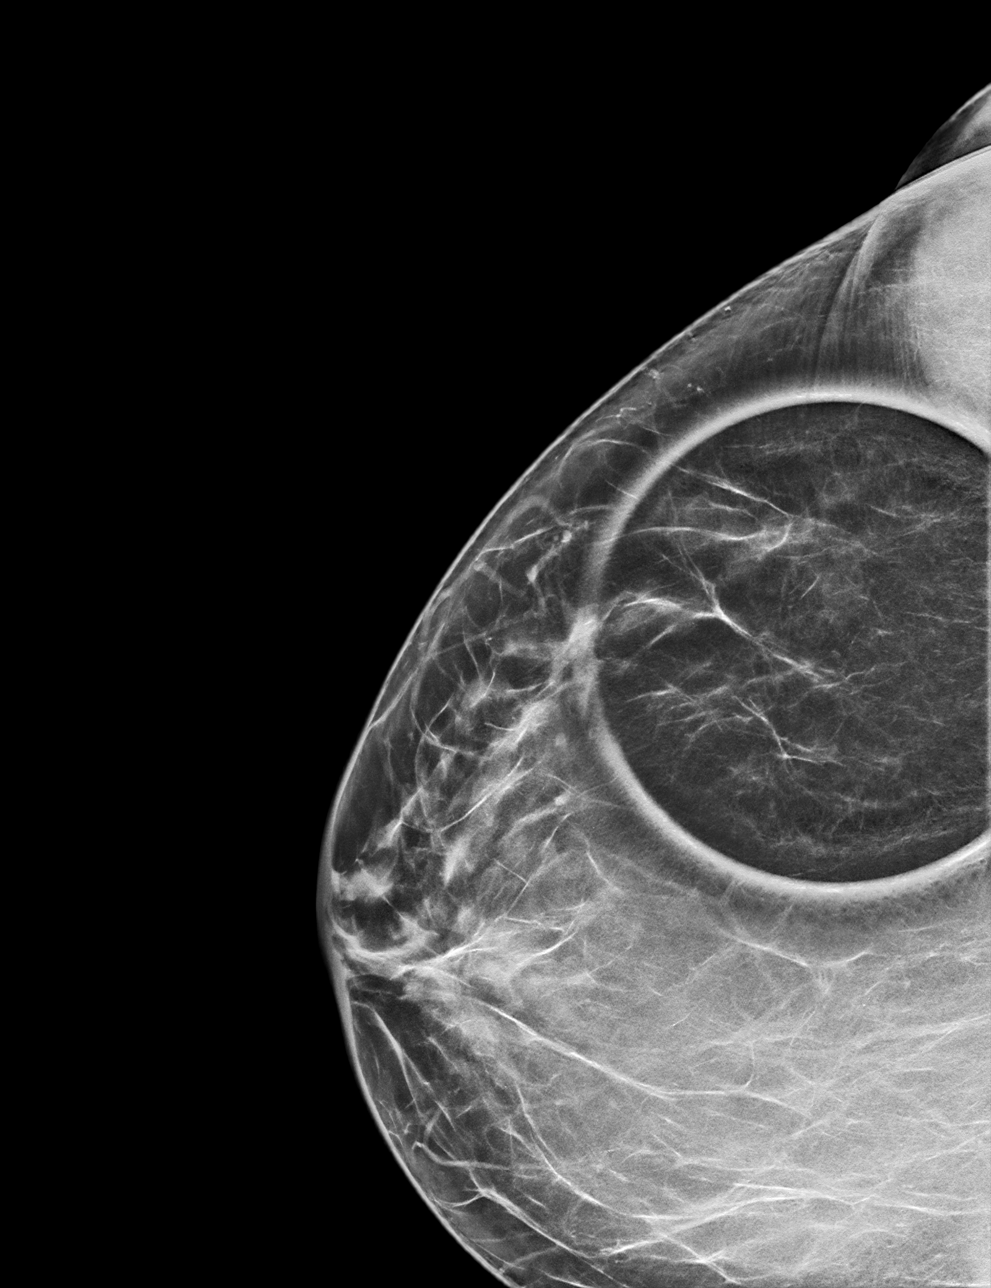

[R ML tomo · tomo slice 37/72.0]
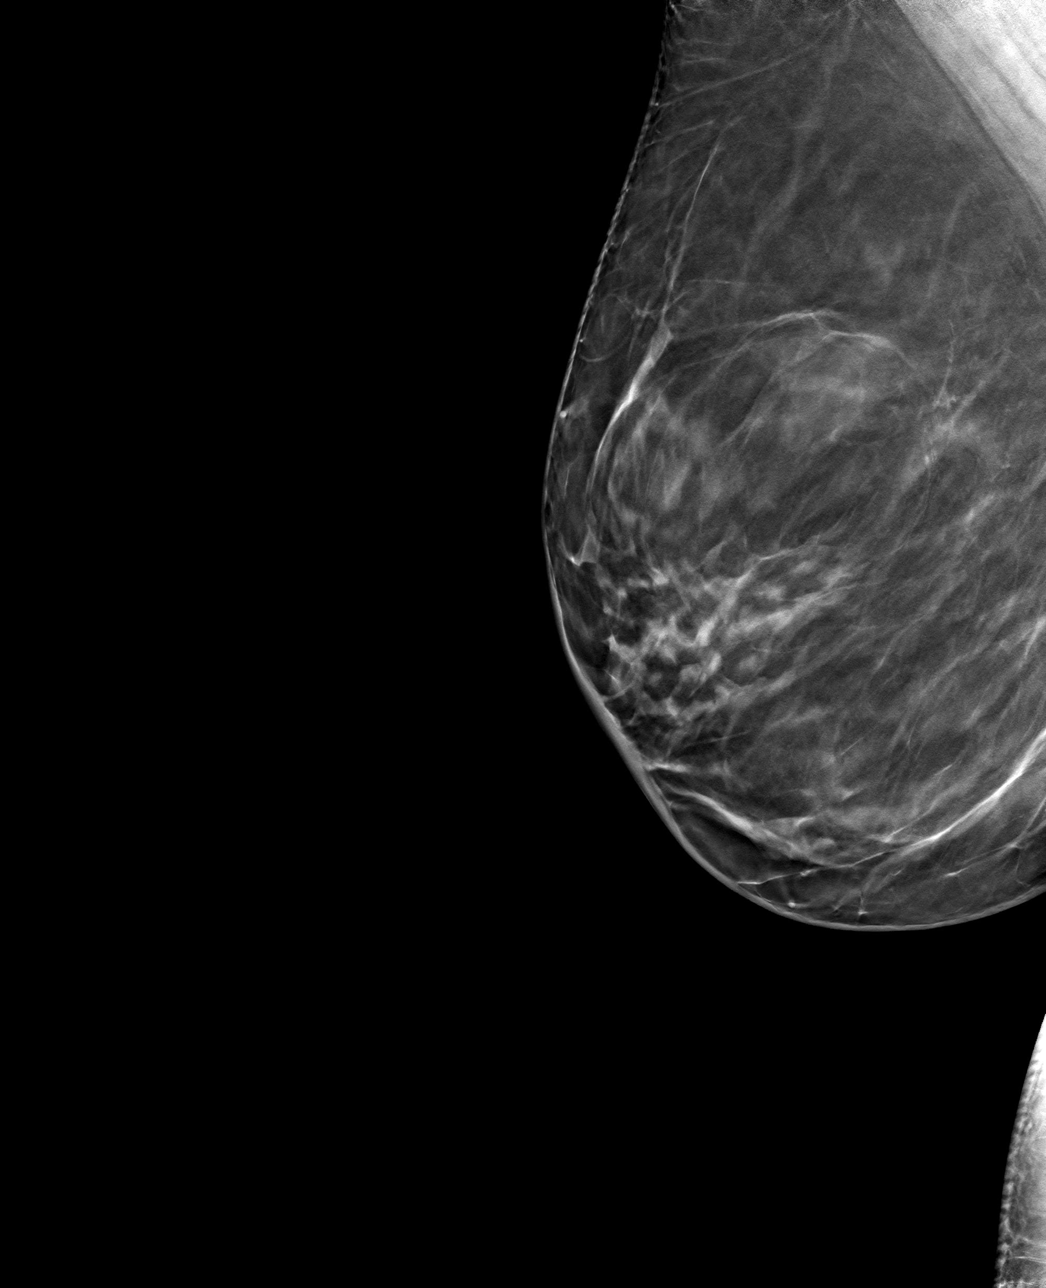

[R CC tomo · tomo slice 35/68.0]
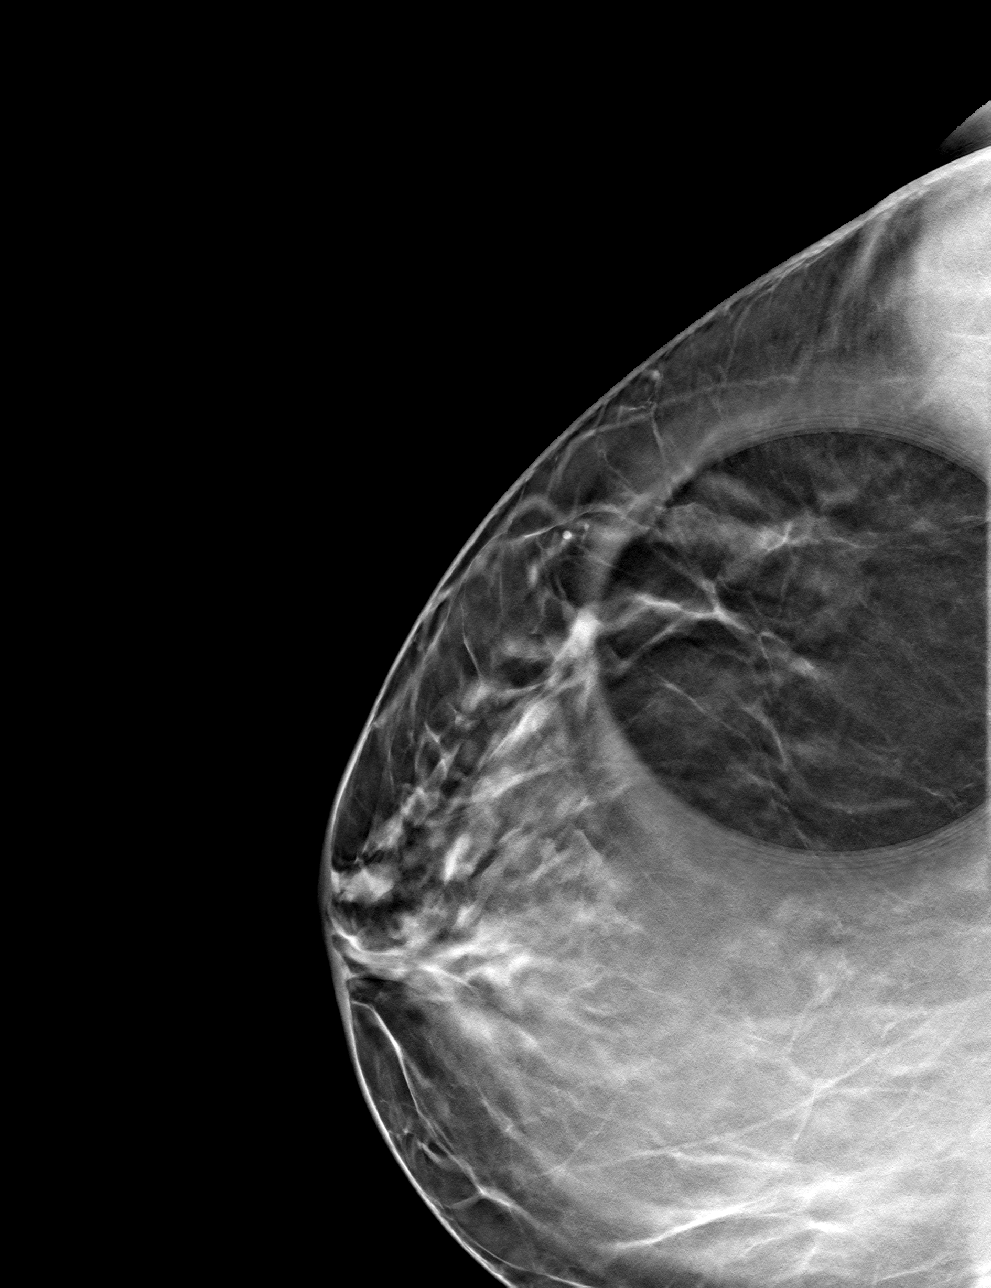

[4 of 12 positions shown; findings below may reference images not displayed]

ACR Breast Density Category b: There are scattered areas of
fibroglandular density.
FINDINGS: Spot compression right cc view, lateral view of right breast are
submitted. Previously questioned asymmetry is less prominent on
additional films. On the additional films, the breast parenchyma of
question is unchanged compared prior exam 8483.
IMPRESSION: Benign findings.

RECOMMENDATION:
Routine screening mammogram back on schedule.

I have discussed the findings and recommendations with the patient.
If applicable, a reminder letter will be sent to the patient
regarding the next appointment.

BI-RADS CATEGORY  2: Benign.

## 2024-02-08 ENCOUNTER — Other Ambulatory Visit: Payer: Self-pay | Admitting: Family

## 2024-02-08 DIAGNOSIS — Z79899 Other long term (current) drug therapy: Secondary | ICD-10-CM

## 2024-02-08 DIAGNOSIS — F900 Attention-deficit hyperactivity disorder, predominantly inattentive type: Secondary | ICD-10-CM

## 2024-02-08 DIAGNOSIS — F410 Panic disorder [episodic paroxysmal anxiety] without agoraphobia: Secondary | ICD-10-CM

## 2024-02-09 ENCOUNTER — Ambulatory Visit
Admission: RE | Admit: 2024-02-09 | Discharge: 2024-02-09 | Disposition: A | Discharge status: Home / Self Care | Source: Ambulatory Visit | Attending: Family | Admitting: Family

## 2024-02-09 DIAGNOSIS — Z1231 Encounter for screening mammogram for malignant neoplasm of breast: Secondary | ICD-10-CM | POA: Insufficient documentation

## 2024-02-11 LAB — TOXASSURE SELECT 13 (MW), URINE

## 2024-02-13 ENCOUNTER — Other Ambulatory Visit: Payer: Self-pay

## 2024-02-13 ENCOUNTER — Ambulatory Visit: Payer: Self-pay

## 2024-02-13 MED ORDER — AMPHETAMINE-DEXTROAMPHET ER 20 MG PO CP24: 20.0000 mg | ORAL_CAPSULE | Freq: Every morning | ORAL | 0 refills | Status: AC

## 2024-03-21 ENCOUNTER — Other Ambulatory Visit: Payer: Self-pay | Admitting: Family

## 2024-03-21 MED ORDER — LISDEXAMFETAMINE DIMESYLATE 40 MG PO CAPS: 40.0000 mg | ORAL_CAPSULE | ORAL | 0 refills | Status: AC

## 2024-04-13 ENCOUNTER — Other Ambulatory Visit: Payer: Self-pay

## 2024-04-13 MED ORDER — ALPRAZOLAM 0.5 MG PO TABS: 0.5000 mg | ORAL_TABLET | Freq: Two times a day (BID) | ORAL | 2 refills | Status: AC | PRN

## 2024-05-07 ENCOUNTER — Other Ambulatory Visit: Payer: Self-pay | Admitting: Family

## 2024-05-15 ENCOUNTER — Encounter: Payer: Self-pay | Admitting: Family

## 2024-05-17 ENCOUNTER — Other Ambulatory Visit: Payer: Self-pay

## 2024-05-17 MED ORDER — PREDNISONE 20 MG PO TABS: 40.0000 mg | ORAL_TABLET | Freq: Every day | ORAL | 0 refills | Status: AC
